# Patient Record
Sex: Male | Born: 1965 | Race: White | Hispanic: Yes | Marital: Married | Smoking: Current every day smoker
Health system: Southern US, Community
[De-identification: ages and names within clinical notes are randomized; demographics above are authoritative.]

---

## 2002-03-27 ENCOUNTER — Emergency Department (HOSPITAL_COMMUNITY): Admission: EM | Admit: 2002-03-27 | Discharge: 2002-03-27 | Payer: Self-pay | Admitting: Emergency Medicine

## 2002-03-27 ENCOUNTER — Encounter: Payer: Self-pay | Admitting: Emergency Medicine

## 2003-12-12 ENCOUNTER — Emergency Department (HOSPITAL_COMMUNITY): Admission: EM | Admit: 2003-12-12 | Discharge: 2003-12-12 | Payer: Self-pay | Admitting: Emergency Medicine

## 2005-05-07 ENCOUNTER — Emergency Department (HOSPITAL_COMMUNITY): Admission: EM | Admit: 2005-05-07 | Discharge: 2005-05-07 | Payer: Self-pay | Admitting: Emergency Medicine

## 2005-05-24 ENCOUNTER — Encounter: Admission: RE | Admit: 2005-05-24 | Discharge: 2005-05-24 | Payer: Self-pay | Admitting: Specialist

## 2005-10-29 IMAGING — CR DG LUMBAR SPINE COMPLETE 4+V
5 series · 5 of 5 positions shown · non-contrast
Comparison: No prior studies.

CLINICAL DATA: Fall with mid back pain. 
 4 VIEW LUMBAR SPINE, 12/12/03:

[view not recorded (1 of 5)]
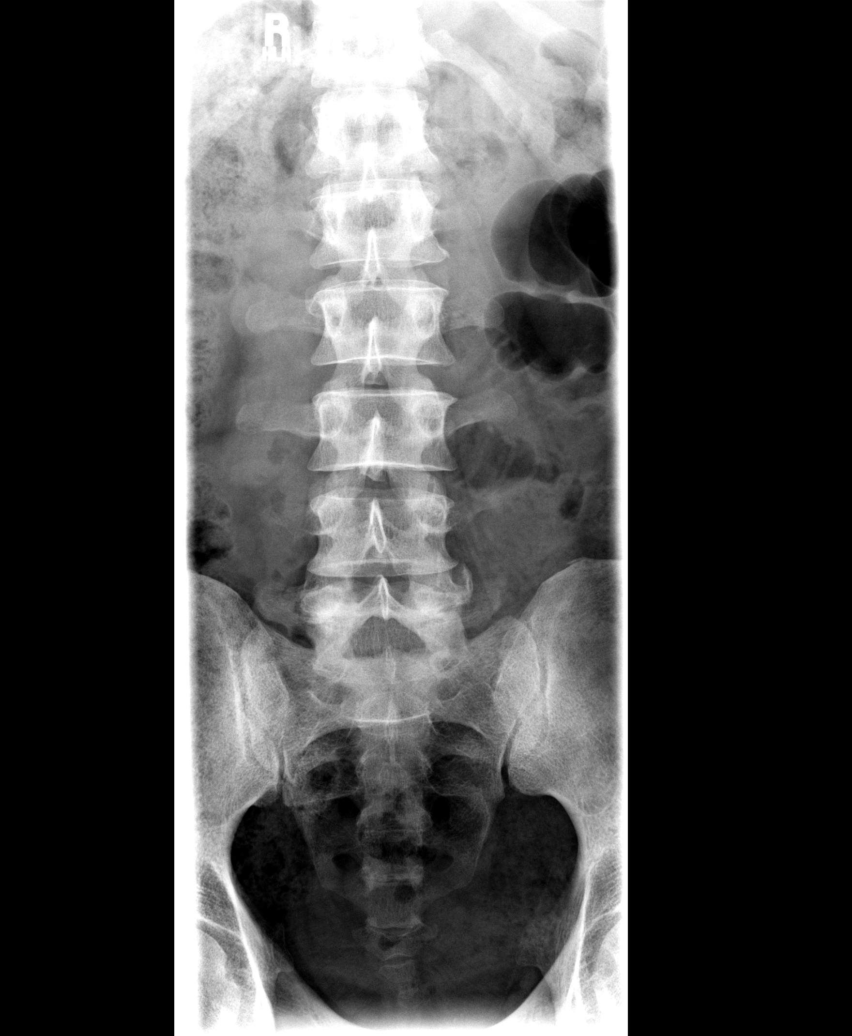

[view not recorded (2 of 5)]
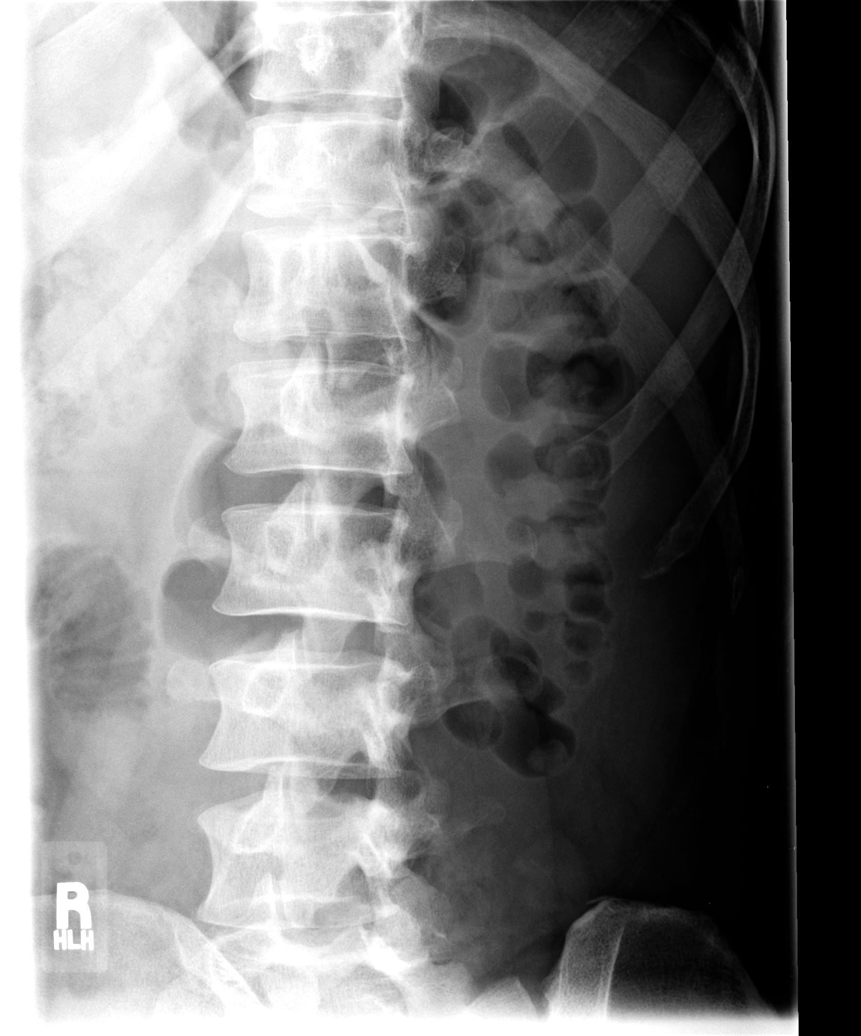

[view not recorded (3 of 5)]
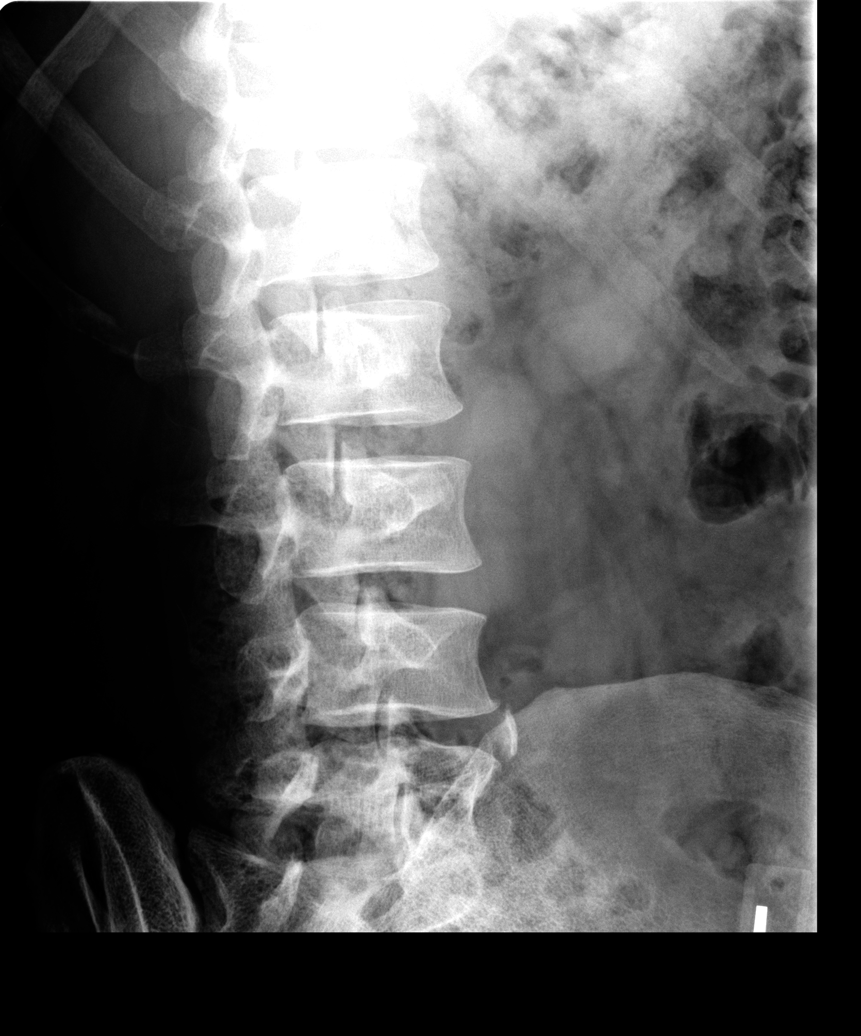

[view not recorded (4 of 5)]
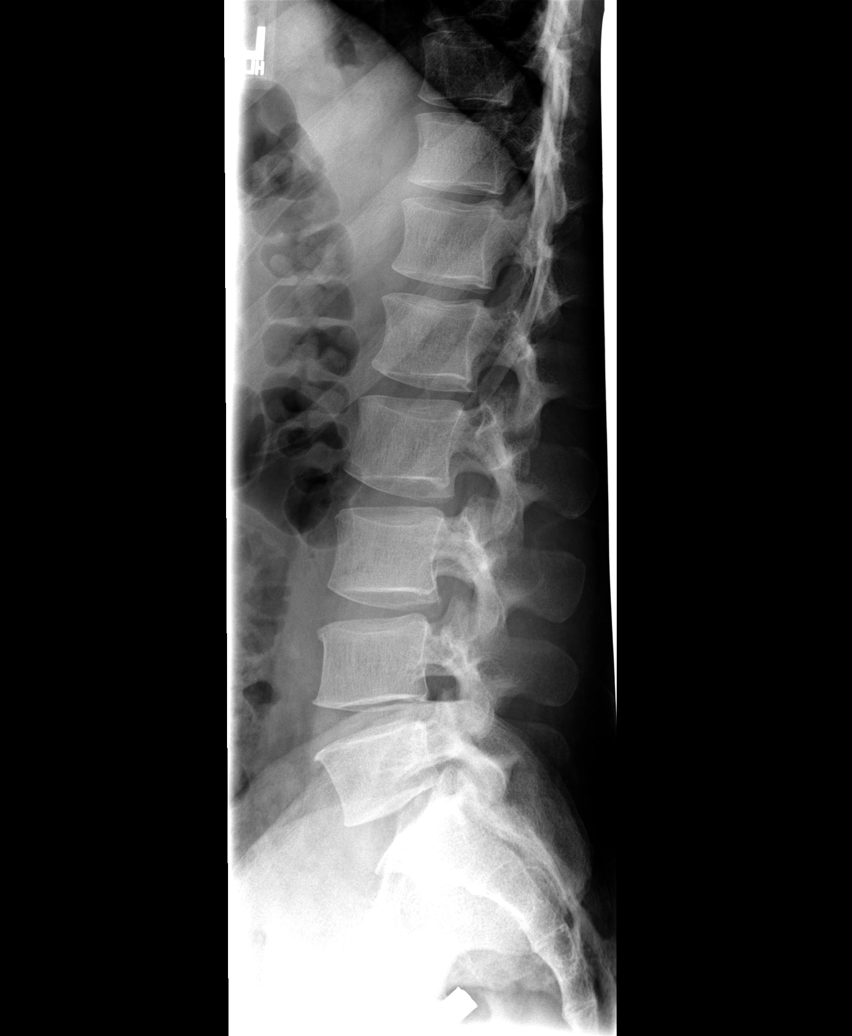

[view not recorded (5 of 5)]
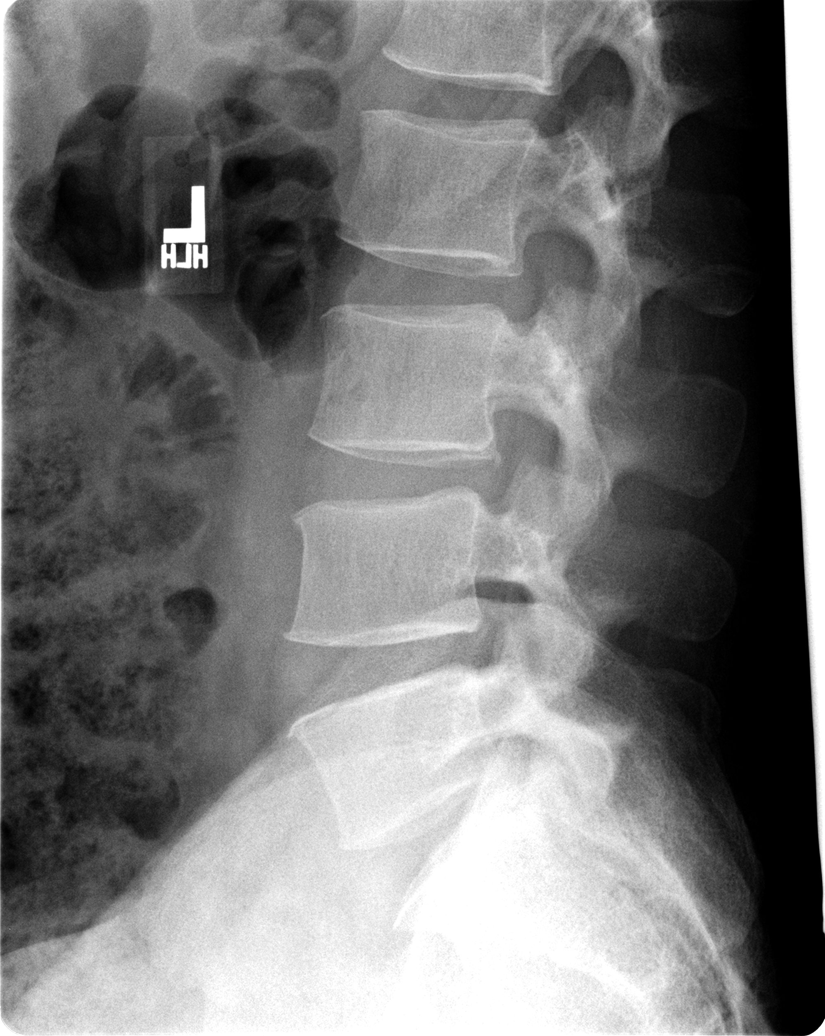

[5 of 5 positions shown; findings below may reference images not displayed]

The T12 ribs are hypoplastic.  There is some spurring at the L4-5 level.  On lateral views there appears to be approximately 2 mm of posterior subluxation of L3 on L4 and of L4 on L5, likely degenerative in nature.  There is also some slight posterior spurring at L3-4.  If pain persists then MRI work-up may be warranted.
IMPRESSION: 1. Mild spondylosis at L3-4 and L4-5 likely accounting for the minimal posterior subluxation at these levels.  No visible fracture.  
 2. Incidentally the T12 ribs appear hypoplastic.

## 2010-01-02 ENCOUNTER — Observation Stay (HOSPITAL_COMMUNITY)
Admission: EM | Admit: 2010-01-02 | Discharge: 2010-01-03 | Payer: Self-pay | Source: Home / Self Care | Attending: Emergency Medicine | Admitting: Emergency Medicine

## 2010-01-07 ENCOUNTER — Emergency Department (HOSPITAL_COMMUNITY)
Admission: EM | Admit: 2010-01-07 | Discharge: 2010-01-07 | Payer: Self-pay | Source: Home / Self Care | Admitting: Family Medicine

## 2010-04-09 LAB — BASIC METABOLIC PANEL
BUN: 11 mg/dL (ref 6–23)
CO2: 29 mEq/L (ref 19–32)
Calcium: 9 mg/dL (ref 8.4–10.5)
Chloride: 107 mEq/L (ref 96–112)
Creatinine, Ser: 0.9 mg/dL (ref 0.4–1.5)
GFR calc Af Amer: >60 mL/min (ref >60)
GFR calc non Af Amer: >60 mL/min (ref >60)
Glucose, Bld: 140 mg/dL — ABNORMAL HIGH (ref 70–99)
Potassium: 4 mEq/L (ref 3.5–5.1)
Sodium: 141 mEq/L (ref 135–145)

## 2010-04-09 LAB — DIFFERENTIAL
Basophils Absolute: 0 10*3/uL (ref 0.0–0.1)
Basophils Relative: 0 % (ref 0–1)
Eosinophils Absolute: 0.1 10*3/uL (ref 0.0–0.7)
Eosinophils Relative: 1 % (ref 0–5)
Lymphocytes Relative: 11 % — ABNORMAL LOW (ref 12–46)
Lymphs Abs: 1.2 10*3/uL (ref 0.7–4.0)
Monocytes Absolute: 1.1 10*3/uL — ABNORMAL HIGH (ref 0.1–1.0)
Monocytes Relative: 10 % (ref 3–12)
Neutro Abs: 9.1 10*3/uL — ABNORMAL HIGH (ref 1.7–7.7)
Neutrophils Relative %: 78 % — ABNORMAL HIGH (ref 43–77)

## 2010-04-09 LAB — CBC
HCT: 34.4 % — ABNORMAL LOW (ref 39.0–52.0)
Hemoglobin: 12.4 g/dL — ABNORMAL LOW (ref 13.0–17.0)
MCH: 32.5 pg (ref 26.0–34.0)
MCHC: 36 g/dL (ref 30.0–36.0)
MCV: 90.1 fL (ref 78.0–100.0)
Platelets: 283 10*3/uL (ref 150–400)
RBC: 3.82 MIL/uL — ABNORMAL LOW (ref 4.22–5.81)
RDW: 11.9 % (ref 11.5–15.5)
WBC: 11.6 10*3/uL — ABNORMAL HIGH (ref 4.0–10.5)

## 2010-04-09 LAB — SEDIMENTATION RATE: Sed Rate: 28 mm/hr — ABNORMAL HIGH (ref 0–16)

## 2011-11-20 IMAGING — CR DG KNEE COMPLETE 4+V*L*
4 series · 4 of 4 positions shown · non-contrast
Comparison: None

CLINICAL DATA: Pain and swelling.

LEFT KNEE - COMPLETE 4+ VIEW

[t knee ap left]
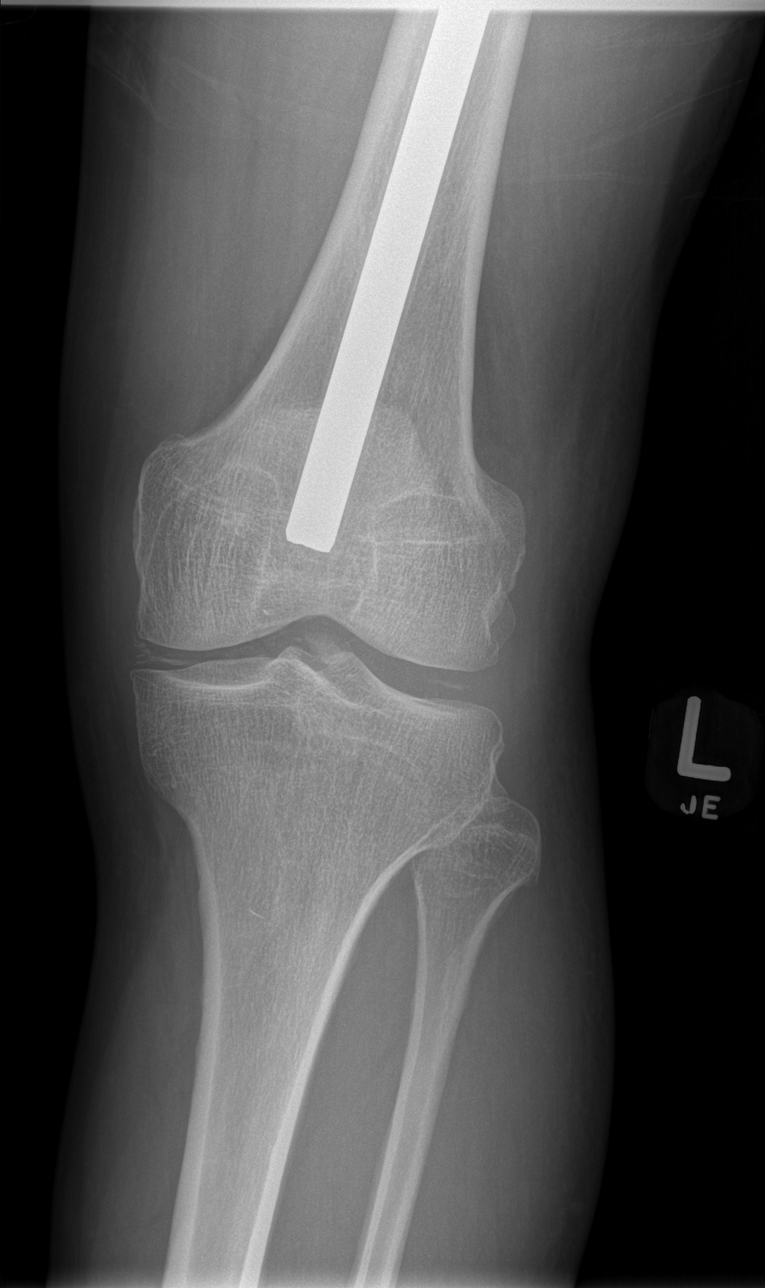

[t knee oblique left (1 of 2)]
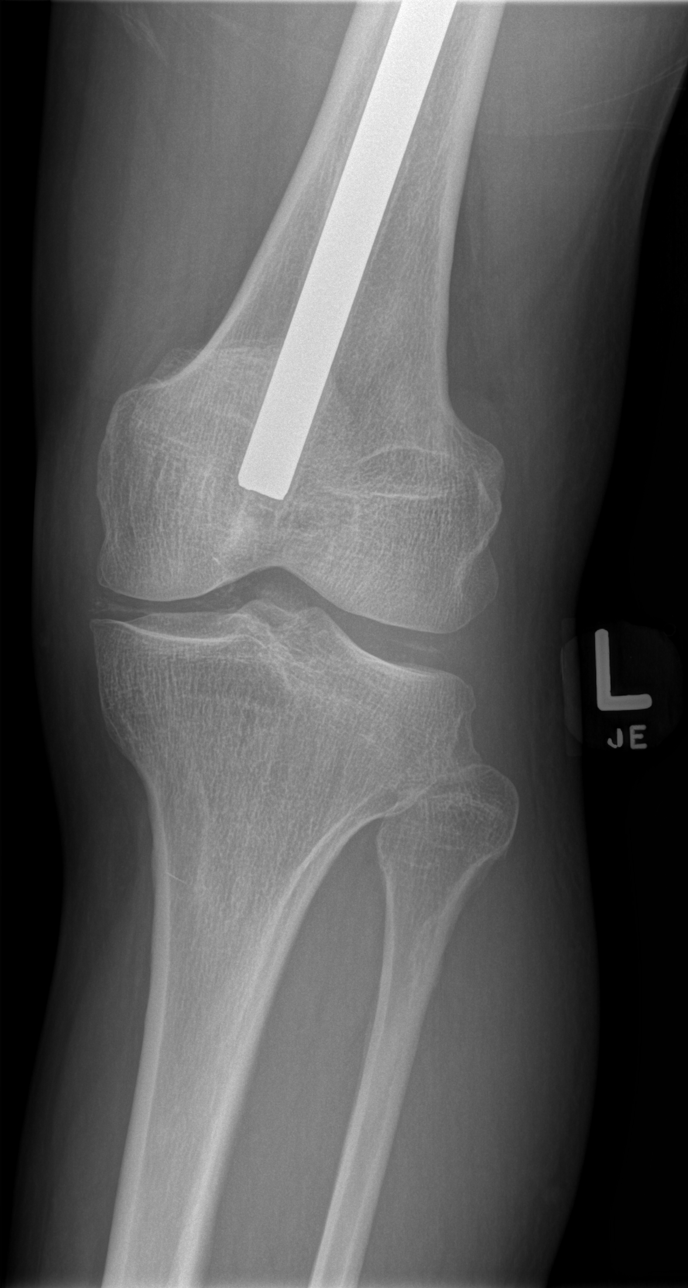

[t knee oblique left (2 of 2)]
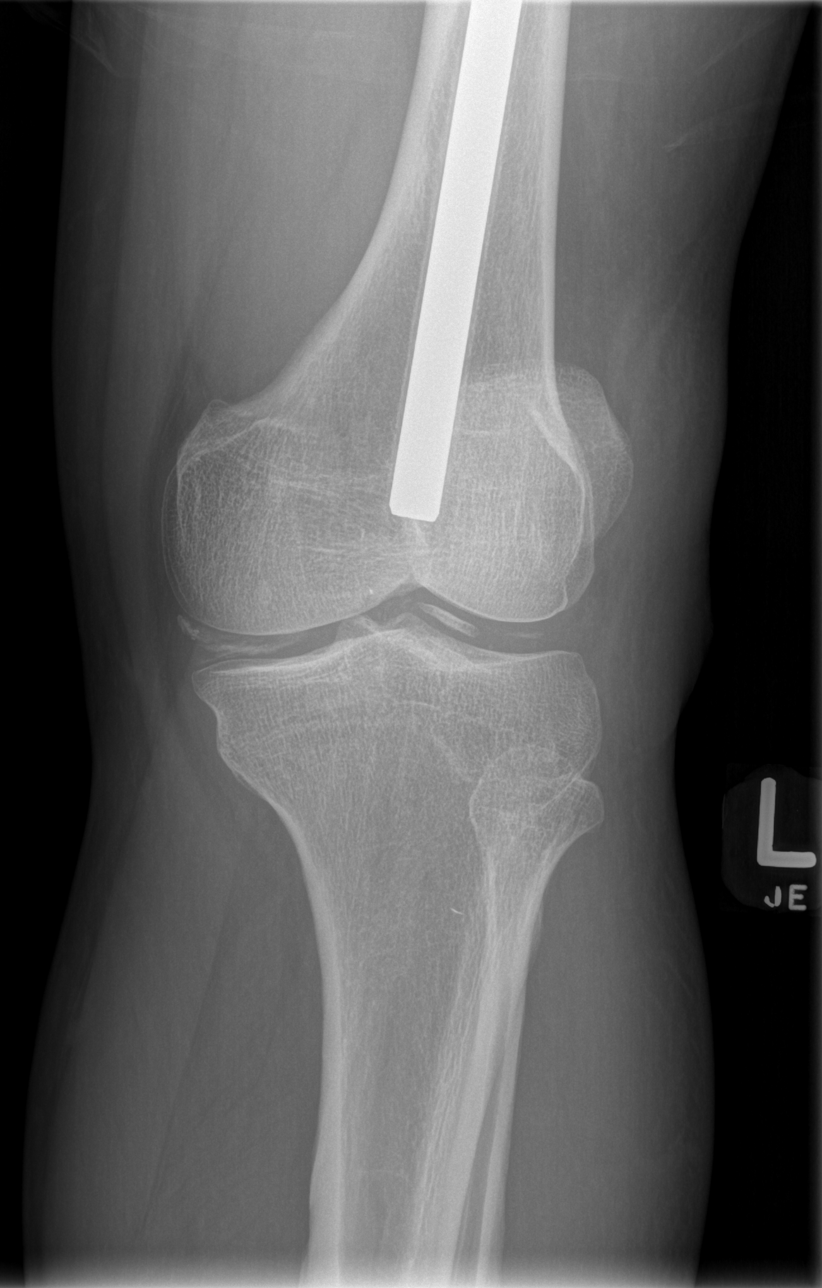

[t knee lat left]
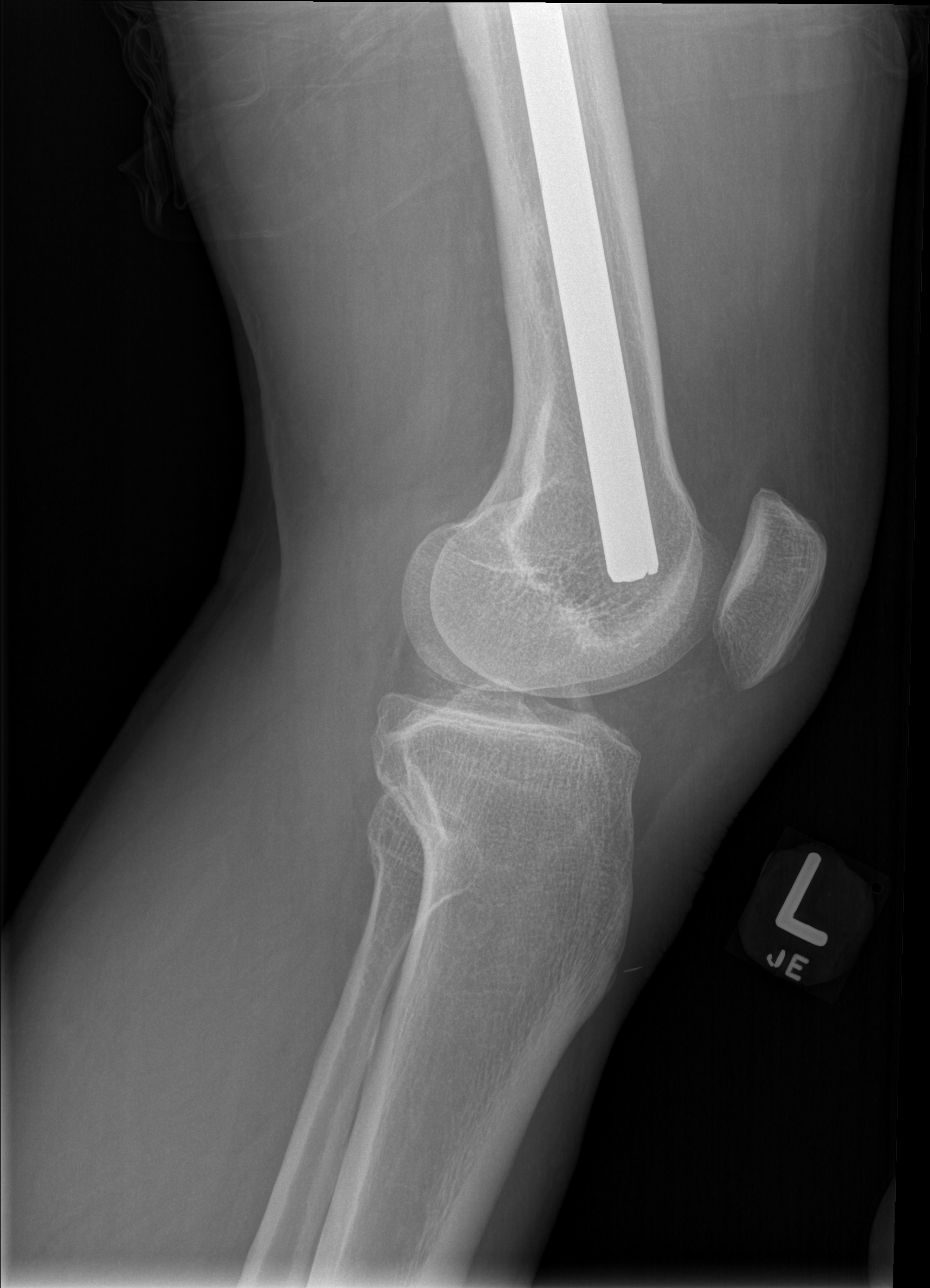

[4 of 4 positions shown; findings below may reference images not displayed]

FINDINGS: There is a intermedullary rod in the femur.  The knee
joint spaces are maintained.  Moderate chondrocalcinosis is noted
possible meniscal ossicle or loose body in the central joint space
laterally.  No acute bony abnormality or osteochondral lesion.
There is a small joint effusion.
IMPRESSION: 1.  No acute bony findings or destructive bony changes.
2.  Chondrocalcinosis and probable loose body.
3.  Small joint effusion.

## 2014-08-29 ENCOUNTER — Encounter: Payer: Self-pay | Admitting: Emergency Medicine

## 2014-08-29 ENCOUNTER — Emergency Department
Admission: EM | Admit: 2014-08-29 | Discharge: 2014-08-29 | Disposition: A | Discharge status: Home / Self Care | Payer: Self-pay | Attending: Emergency Medicine | Admitting: Emergency Medicine

## 2014-08-29 DIAGNOSIS — M5431 Sciatica, right side: Secondary | ICD-10-CM | POA: Insufficient documentation

## 2014-08-29 DIAGNOSIS — Z720 Tobacco use: Secondary | ICD-10-CM | POA: Insufficient documentation

## 2014-08-29 MED ORDER — NAPROXEN 500 MG PO TABS: 500.0000 mg | ORAL_TABLET | Freq: Two times a day (BID) | ORAL | Status: AC

## 2014-08-29 MED ORDER — PREDNISONE 50 MG PO TABS: 50.0000 mg | ORAL_TABLET | Freq: Every day | ORAL | Status: AC

## 2014-08-29 NOTE — ED Notes (Signed)
Having pain to right hip/leg area times 1 week

## 2014-08-29 NOTE — ED Notes (Signed)
E-signature not working. 

## 2014-08-29 NOTE — ED Provider Notes (Signed)
[facility] Emergency Department Provider Note  ____________________________________________  Time seen: On arrival  I have reviewed the triage vital signs and the nursing notes.   HISTORY  Chief Complaint Leg Pain    HPI Miguel Solomon is a 49 y.o. male who presents with complaints of pain traveling down from his right hip to his foot when he moves it a certain way. He reports the pain is sharp in nature and moderate to severe. He notes it usually happens when he is going to sit down. Sometimes it is worse in the morning and improves as the day goes on. He said this discomfort for approximately a week and a half. He does not do IV drugs. No fevers no chills. No neuro deficits. No difficulty urinating    History reviewed. No pertinent past medical history.  There are no active problems to display for this patient.   History reviewed. No pertinent past surgical history.  Current Outpatient Rx  Name  Route  Sig  Dispense  Refill  . naproxen (NAPROSYN) 500 MG tablet   Oral   Take 1 tablet (500 mg total) by mouth 2 (two) times daily with a meal.   20 tablet   2   . predniSONE (DELTASONE) 50 MG tablet   Oral   Take 1 tablet (50 mg total) by mouth daily with breakfast.   5 tablet   0     Allergies Review of patient's allergies indicates no known allergies.  No family history on file.  Social History History  Substance Use Topics  . Smoking status: Current Every Day Smoker  . Smokeless tobacco: Not on file  . Alcohol Use: Not on file    Review of Systems  Constitutional: Negative for fever. Eyes: Negative for visual changes. ENT: Negative for sore throat   Genitourinary: Negative for dysuria. Musculoskeletal: Negative for back pain. Skin: Negative for rash. Neurological: Negative for headaches or focal weakness   ____________________________________________   PHYSICAL EXAM:  VITAL SIGNS: ED Triage Vitals  Enc Vitals Group    BP 08/29/14 1035 139/78 mmHg     Pulse Rate 08/29/14 1035 80     Resp 08/29/14 1035 20     Temp 08/29/14 1035 98 F (36.7 C)     Temp Source 08/29/14 1035 Oral     SpO2 08/29/14 1035 98 %     Weight 08/29/14 1035 180 lb (81.647 kg)     Height 08/29/14 1035  (1.651 m)     Head Cir --      Peak Flow --      Pain Score 08/29/14 1036 5     Pain Loc --      Pain Edu? --      Excl. in GC? --      Constitutional: Alert and oriented. Well appearing and in no distress. Eyes: Conjunctivae are normal.  ENT   Head: Normocephalic and atraumatic.   Mouth/Throat: Mucous membranes are moist. Cardiovascular: Normal rate, regular rhythm.  Respiratory: Normal respiratory effort without tachypnea nor retractions.  Gastrointestinal: Soft and non-tender in all quadrants. No distention. There is no CVA tenderness. Musculoskeletal: Nontender with normal range of motion in all extremities. Neurologic:  Normal speech and language. No gross focal neurologic deficits are appreciated. Normal reflexes in lower extremities bilaterally Skin:  Skin is warm, dry and intact. No rash noted. Psychiatric: Mood and affect are normal. Patient exhibits appropriate insight and judgment.  ____________________________________________    LABS (pertinent positives/negatives)  Labs Reviewed - No data to display  ____________________________________________     ____________________________________________    RADIOLOGY I have personally reviewed any xrays that were ordered on this patient: None  ____________________________________________   PROCEDURES  Procedure(s) performed: none   ____________________________________________   INITIAL IMPRESSION / ASSESSMENT AND PLAN / ED COURSE  Pertinent labs & imaging results that were available during my care of the patient were reviewed by me and considered in my medical decision making (see chart for details).  Patient's history of present  illness most consistent with sciatica. Neurologically intact. No concern of infection. No abdominal tenderness to palpation. I will prescribe a course of steroid's and NSAIDs  ____________________________________________   FINAL CLINICAL IMPRESSION(S) / ED DIAGNOSES  Final diagnoses:  Sciatica, right     Jene Every, MD 08/29/14 1104

## 2015-02-21 ENCOUNTER — Encounter (HOSPITAL_COMMUNITY): Payer: Self-pay | Admitting: *Deleted

## 2015-02-21 ENCOUNTER — Emergency Department (HOSPITAL_COMMUNITY)
Admission: EM | Admit: 2015-02-21 | Discharge: 2015-02-21 | Disposition: A | Discharge status: Home / Self Care | Payer: Self-pay | Attending: Emergency Medicine | Admitting: Emergency Medicine

## 2015-02-21 DIAGNOSIS — Z7952 Long term (current) use of systemic steroids: Secondary | ICD-10-CM | POA: Insufficient documentation

## 2015-02-21 DIAGNOSIS — R300 Dysuria: Secondary | ICD-10-CM | POA: Insufficient documentation

## 2015-02-21 DIAGNOSIS — Z791 Long term (current) use of non-steroidal anti-inflammatories (NSAID): Secondary | ICD-10-CM | POA: Insufficient documentation

## 2015-02-21 DIAGNOSIS — F17200 Nicotine dependence, unspecified, uncomplicated: Secondary | ICD-10-CM | POA: Insufficient documentation

## 2015-02-21 DIAGNOSIS — R3915 Urgency of urination: Secondary | ICD-10-CM | POA: Insufficient documentation

## 2015-02-21 LAB — URINALYSIS, ROUTINE W REFLEX MICROSCOPIC
Bilirubin Urine: NEGATIVE
Glucose, UA: NEGATIVE mg/dL
Hgb urine dipstick: NEGATIVE
Ketones, ur: NEGATIVE mg/dL
Nitrite: NEGATIVE
Protein, ur: NEGATIVE mg/dL
Specific Gravity, Urine: 1.029 (ref 1.005–1.030)
pH: 7 (ref 5.0–8.0)

## 2015-02-21 LAB — URINE MICROSCOPIC-ADD ON: RBC / HPF: NONE SEEN RBC/hpf (ref 0–5)

## 2015-02-21 MED ORDER — LIDOCAINE HCL (PF) 1 % IJ SOLN
INTRAMUSCULAR | Status: AC
  Administered 2015-02-21: 5 mL
  Filled 2015-02-21: qty 5

## 2015-02-21 MED ORDER — CEFTRIAXONE SODIUM 250 MG IJ SOLR
250.0000 mg | Freq: Once | INTRAMUSCULAR | Status: AC
  Administered 2015-02-21: 250 mg via INTRAMUSCULAR
  Filled 2015-02-21: qty 250

## 2015-02-21 MED ORDER — CEPHALEXIN 500 MG PO CAPS: 500.0000 mg | ORAL_CAPSULE | Freq: Four times a day (QID) | ORAL | Status: AC

## 2015-02-21 MED ORDER — AZITHROMYCIN 1 G PO PACK
1.0000 g | PACK | Freq: Once | ORAL | Status: AC
  Administered 2015-02-21: 1 g via ORAL
  Filled 2015-02-21: qty 1

## 2015-02-21 NOTE — ED Provider Notes (Signed)
CSN: [id number]     Arrival date & time 02/21/15  1857 History  By signing my name below, I, Miguel Solomon, attest that this documentation has been prepared under the direction and in the presence of non-physician practitioner, Elizabeth Sauer, PA-C. Electronically Signed: Linna Darner, Scribe. 02/21/2015. 8:43 PM.     Chief Complaint  Patient presents with  . Urinary Tract Infection   The history is provided by the patient. No language interpreter was used.     HPI Comments: Miguel Solomon is a 50 y.o. male who presents to the Emergency Department complaining of dysuria beginning about three weeks ago. Pt endorses that he had unprotected sex recently and is worried he is "sick from that". Pt reports that he cannot hold his urine for a long period of time but endorses that he has been urinating a normal amount. Pt denies hematuria, abnormal penile discharge, fever, abdominal pain, nausea/vomiting, back pain, or any other associated symptoms at this time.   History reviewed. No pertinent past medical history. History reviewed. No pertinent past surgical history. No family history on file. Social History  Substance Use Topics  . Smoking status: Current Every Day Smoker  . Smokeless tobacco: None  . Alcohol Use: None    Review of Systems  Constitutional: Negative for fever.  HENT: Negative for congestion.   Eyes: Negative for visual disturbance.  Respiratory: Negative for cough and shortness of breath.   Cardiovascular: Negative for chest pain.  Gastrointestinal: Negative for nausea, vomiting and abdominal pain.  Genitourinary: Positive for dysuria and urgency. Negative for hematuria, decreased urine volume and discharge.  Musculoskeletal: Negative for back pain.  Allergic/Immunologic: Negative for immunocompromised state.  Neurological: Negative for headaches.   Allergies  Review of patient's allergies indicates no known allergies.  Home Medications   Prior to Admission  medications   Medication Sig Start Date End Date Taking? Authorizing Provider  cephALEXin (KEFLEX) 500 MG capsule Take 1 capsule (500 mg total) by mouth 4 (four) times daily. 02/21/15   Chase Picket Sonda Coppens, PA-C  naproxen (NAPROSYN) 500 MG tablet Take 1 tablet (500 mg total) by mouth 2 (two) times daily with a meal. 08/29/14   Jene Every, MD  predniSONE (DELTASONE) 50 MG tablet Take 1 tablet (50 mg total) by mouth daily with breakfast. 08/29/14   Jene Every, MD   BP 120/82 mmHg  Pulse 66  Temp(Src) 98.2 F (36.8 C) (Oral)  Resp 16  Wt 79.379 kg  SpO2 99% Physical Exam  Constitutional: He is oriented to person, place, and time. He appears well-developed and well-nourished. No distress.  HENT:  Head: Normocephalic and atraumatic.  Eyes: Conjunctivae and EOM are normal.  Neck: Neck supple. No tracheal deviation present.  Cardiovascular: Normal rate and normal heart sounds.   Pulmonary/Chest: Effort normal and breath sounds normal. No respiratory distress.  Abdominal: There is no CVA tenderness.  Musculoskeletal: Normal range of motion.  Neurological: He is alert and oriented to person, place, and time.  Skin: Skin is warm and dry.  Psychiatric: He has a normal mood and affect. His behavior is normal.  Nursing note and vitals reviewed.   ED Course  Procedures (including critical care time)  DIAGNOSTIC STUDIES: Oxygen Saturation is 98% on RA, normal by my interpretation.    COORDINATION OF CARE:  8:22 PM Will order urinalysis. Discussed treatment plan with pt at bedside and pt agreed to plan.   Labs Review Labs Reviewed  URINALYSIS, ROUTINE W REFLEX MICROSCOPIC (NOT AT [facility]) -  Abnormal; Notable for the following:    Leukocytes, UA SMALL (*)    All other components within normal limits  URINE MICROSCOPIC-ADD ON - Abnormal; Notable for the following:    Squamous Epithelial / LPF 0-5 (*)    Bacteria, UA RARE (*)    All other components within normal limits  URINE CULTURE   GC/CHLAMYDIA PROBE AMP () NOT AT [facility]    Imaging Review No results found. I have personally reviewed and evaluated these lab results as part of my medical decision-making.   EKG Interpretation None      MDM   Final diagnoses:  Dysuria   [name] presents with dysuria and concern for STD's. Denies penile discharge, testicular pain. UA reviewed and sent for culture. Due to patient having symptoms, will treat. Offered patient prophylactic STD treatment which he accepted. Informed patient that G&C results will take 2-3 days and he will get a call if positive. Urology follow up if symptoms persist > 1 week. Patient agreeable to plan.   I personally performed the services described in this documentation, which was scribed in my presence. The recorded information has been reviewed and is accurate.   [facility] Zhuri Krass, PA-C 02/21/15 5784  Arby Barrette, MD 02/22/15 [phone]

## 2015-02-21 NOTE — ED Notes (Signed)
Pt has had pain with urination for 3 weeks.  Pain is a burning pain with urination, pt denies any swelling or redness or other penile drainage.  Pt admits to unprotected sex

## 2015-02-21 NOTE — Discharge Instructions (Signed)
Please take all of your antibiotics until finished!  If symptoms do not improve after one week, follow up with the urology clinic listed.  Return to ER for fever, new or worsening symptoms, any additional concerns.

## 2015-02-22 LAB — GC/CHLAMYDIA PROBE AMP (~~LOC~~) NOT AT ARMC
Chlamydia: NEGATIVE
Neisseria Gonorrhea: POSITIVE — AB

## 2015-02-23 ENCOUNTER — Telehealth (HOSPITAL_BASED_OUTPATIENT_CLINIC_OR_DEPARTMENT_OTHER): Payer: Self-pay | Admitting: Emergency Medicine

## 2015-02-23 LAB — URINE CULTURE: Culture: 2000

## 2015-02-26 ENCOUNTER — Telehealth (HOSPITAL_COMMUNITY): Payer: Self-pay

## 2015-02-26 NOTE — Telephone Encounter (Signed)
Pt calling for STD results after receiving letter.  ID verified x 2.  Pt gave ok to speak to his "wife" current Clinical research associate asked pt if he was sure he wanted me to speak to "wife" and he sia yes informed her (+) gonorrhea, tx approp., notify partner at which time she said they came together and were treated at the same time and told to abstain from sex x 2 wks.

## 2020-04-21 ENCOUNTER — Encounter: Payer: Self-pay | Admitting: Physician Assistant

## 2020-04-21 ENCOUNTER — Other Ambulatory Visit: Payer: Self-pay

## 2020-04-21 ENCOUNTER — Ambulatory Visit: Payer: Self-pay | Admitting: Physician Assistant

## 2020-04-21 DIAGNOSIS — Z202 Contact with and (suspected) exposure to infections with a predominantly sexual mode of transmission: Secondary | ICD-10-CM

## 2020-04-21 DIAGNOSIS — Z113 Encounter for screening for infections with a predominantly sexual mode of transmission: Secondary | ICD-10-CM

## 2020-04-21 LAB — GRAM STAIN

## 2020-04-21 MED ORDER — DOXYCYCLINE HYCLATE 100 MG PO TABS: 100.0000 mg | ORAL_TABLET | Freq: Two times a day (BID) | ORAL | 0 refills | Status: AC

## 2020-04-21 NOTE — Progress Notes (Signed)
[facility] Department STI clinic/screening visit  Subjective:  Miguel Solomon is a 55 y.o. male being seen today for an STI screening visit. The patient reports they do not have symptoms.    Patient has the following medical conditions:  There are no problems to display for this patient.    Chief Complaint  Patient presents with  . STD screen    HPI  Patient reports that he is not having symptoms but a partner told him that her test was positive for Chlamydia.  Denies chronic conditions and regular medicines.  States he has not had a HIV test previously and his last void prior to sample collection for Gram stain was over 2 hr ago.   See flowsheet for further details and programmatic requirements.    The following portions of the patient's history were reviewed and updated as appropriate: allergies, current medications, past medical history, past social history, past surgical history and problem list.  Objective:  There were no vitals filed for this visit.  Physical Exam Constitutional:      General: He is not in acute distress.    Appearance: Normal appearance.  HENT:     Head: Normocephalic and atraumatic.     Comments: No nits,lice, or hair loss. No cervical, supraclavicular or axillary adenopathy.    Mouth/Throat:     Mouth: Mucous membranes are moist.     Pharynx: Oropharynx is clear. No oropharyngeal exudate or posterior oropharyngeal erythema.  Eyes:     Conjunctiva/sclera: Conjunctivae normal.  Pulmonary:     Effort: Pulmonary effort is normal.  Abdominal:     Palpations: Abdomen is soft. There is no mass.     Tenderness: There is no abdominal tenderness. There is no guarding or rebound.  Genitourinary:    Penis: Normal.      Testes: Normal.     Comments: Pubic area without nits, lice, hair loss, edema, erythema, lesions and inguinal adenopathy. Penis uncircumcised without rash, lesions and discharge at meatus. Testicles descended bilaterally  without masses or tenderness. Musculoskeletal:     Cervical back: Neck supple. No tenderness.  Skin:    General: Skin is warm and dry.     Findings: No bruising, erythema, lesion or rash.  Neurological:     Mental Status: He is alert and oriented to person, place, and time.  Psychiatric:        Mood and Affect: Mood normal.        Behavior: Behavior normal.        Thought Content: Thought content normal.        Judgment: Judgment normal.       Assessment and Plan:  Miguel Solomon is a 55 y.o. male presenting to the [facility] Department for STI screening  1. Screening for venereal disease Patient into clinic without symptoms. Rec condoms with all sex. Await test results.  Counseled that RN will call if needs to RTC for treatment once results are back. - Gram stain - Gonococcus culture - HIV/HCV [facility] - Syphilis Serology, [facility] - HBV Antigen/Antibody [facility]  2. Chlamydia contact Treat as a contact to Chlamydia with Doxycycline 100 mg #14 1 po BID for 7 days with food. No sex for 14 days and until after partner completes treatment. Call with questions or concerns. - doxycycline (VIBRA-TABS) 100 MG tablet; Take 1 tablet (100 mg total) by mouth 2 (two) times daily for 7 days.  Dispense: 14 tablet; Refill: 0  No follow-ups on file.  No future appointments.  Jerene Dilling, PA

## 2020-04-21 NOTE — Progress Notes (Signed)
Gram stain reviewed. Patient tx'd per C. Paris PA order Richmond Campbell, RN

## 2020-04-25 LAB — GONOCOCCUS CULTURE

## 2020-04-26 LAB — HEPATITIS B SURFACE ANTIGEN: Hepatitis B Surface Ag: NEGATIVE

## 2020-04-27 LAB — HM HEPATITIS C SCREENING LAB: HM Hepatitis Screen: NEGATIVE

## 2020-04-27 LAB — HM HIV SCREENING LAB: HM HIV Screening: NEGATIVE

## 2020-04-27 NOTE — Progress Notes (Signed)
[facility]
